# Patient Record
Sex: Male | Born: 1990 | Race: Black or African American | Hispanic: No | Marital: Single | Smoking: Current some day smoker
Health system: Southern US, Community
[De-identification: ages and names within clinical notes are randomized; demographics above are authoritative.]

## PROBLEM LIST (undated history)

## (undated) DIAGNOSIS — N200 Calculus of kidney: Secondary | ICD-10-CM

---

## 2017-02-11 ENCOUNTER — Other Ambulatory Visit: Payer: Self-pay

## 2017-02-11 ENCOUNTER — Encounter (HOSPITAL_BASED_OUTPATIENT_CLINIC_OR_DEPARTMENT_OTHER): Payer: Self-pay | Admitting: *Deleted

## 2017-02-11 ENCOUNTER — Emergency Department (HOSPITAL_BASED_OUTPATIENT_CLINIC_OR_DEPARTMENT_OTHER)
Admission: EM | Admit: 2017-02-11 | Discharge: 2017-02-11 | Disposition: A | Discharge status: Home / Self Care | Payer: Managed Care, Other (non HMO) | Attending: Emergency Medicine | Admitting: Emergency Medicine

## 2017-02-11 DIAGNOSIS — R531 Weakness: Secondary | ICD-10-CM | POA: Diagnosis not present

## 2017-02-11 DIAGNOSIS — Z79899 Other long term (current) drug therapy: Secondary | ICD-10-CM | POA: Diagnosis not present

## 2017-02-11 DIAGNOSIS — F17200 Nicotine dependence, unspecified, uncomplicated: Secondary | ICD-10-CM | POA: Insufficient documentation

## 2017-02-11 DIAGNOSIS — R110 Nausea: Secondary | ICD-10-CM | POA: Diagnosis not present

## 2017-02-11 MED ORDER — ONDANSETRON 4 MG PO TBDP: 4.0000 mg | ORAL_TABLET | Freq: Three times a day (TID) | ORAL | 0 refills | Status: DC | PRN

## 2017-02-11 MED ORDER — ONDANSETRON 4 MG PO TBDP
4.0000 mg | ORAL_TABLET | Freq: Once | ORAL | Status: AC
  Administered 2017-02-11: 4 mg via ORAL
  Filled 2017-02-11: qty 1

## 2017-02-11 MED FILL — ONDANSETRON ODT 4 MG TABLET: 4 | 3 days supply | Qty: 10 | Fill #0

## 2017-02-11 NOTE — ED Triage Notes (Signed)
Pt c/o URI symptoms  x 1 day  

## 2017-02-11 NOTE — ED Notes (Signed)
Pt and family understood dc material. NAD noted. Scripts sent to [facility] pharmacy. Provider aware of DC vitals

## 2017-02-11 NOTE — ED Provider Notes (Signed)
[facility] EMERGENCY DEPARTMENT Provider Note   CSN: [id number] Arrival date & time: 02/11/17  1401     History   Chief Complaint Chief Complaint  Patient presents with  . URI    HPI Louis Obrien is a 27 y.o. male.  Patient presents with acute onset of nausea, weakness, chills starting early this morning.  Patient woke from sleep with severe nausea without abdominal or chest pain.  No vomiting.  Mild muscle aches.  No ear pain, nasal congestion, sore throat.  No significant cough.  No skin rashes.  No diarrhea or constipation.  No urinary symptoms.  Patient feels like he has a flulike illness.  No known contacts.      History reviewed. No pertinent past medical history.  There are no active problems to display for this patient.   History reviewed. No pertinent surgical history.     Home Medications    Prior to Admission medications   Medication Sig Start Date End Date Taking? Authorizing Provider  doxycycline (ADOXA) 100 MG tablet Take 100 mg by mouth 2 (two) times daily.   Yes [provider]  Pseudoeph-Doxylamine-DM-APAP (NYQUIL PO) Take by mouth.   Yes [provider]  ondansetron (ZOFRAN ODT) 4 MG disintegrating tablet Take 1 tablet (4 mg total) by mouth every 8 (eight) hours as needed for nausea or vomiting. 02/11/17   Renne CriglerGeiple, Conception Doebler, PA-C    Family History History reviewed. No pertinent family history.  Social History Social History   Tobacco Use  . Smoking status: Current Some Day Smoker  . Smokeless tobacco: Never Used  Substance Use Topics  . Alcohol use: No    Frequency: Never  . Drug use: No     Allergies   Patient has no known allergies.   Review of Systems Review of Systems  Constitutional: Positive for fatigue. Negative for appetite change and fever.  HENT: Negative for congestion, ear pain, rhinorrhea and sore throat.   Eyes: Negative for redness.  Respiratory: Negative for cough and shortness of breath.     Cardiovascular: Negative for chest pain.  Gastrointestinal: Positive for nausea. Negative for abdominal pain, blood in stool, diarrhea and vomiting.       Negative for hematemesis  Genitourinary: Negative for dysuria.  Musculoskeletal: Positive for myalgias.  Skin: Negative for rash.  Neurological: Negative for light-headedness.     Physical Exam Updated Vital Signs BP (!) 162/109   Pulse 78   Temp 98.2 F (36.8 C) (Oral)   Resp 18   SpO2 99%   Physical Exam  Constitutional: He appears well-developed and well-nourished.  HENT:  Head: Normocephalic and atraumatic.  Right Ear: Tympanic membrane, external ear and ear canal normal.  Left Ear: Tympanic membrane, external ear and ear canal normal.  Nose: Nose normal. No mucosal edema or rhinorrhea.  Mouth/Throat: Uvula is midline and mucous membranes are normal. Mucous membranes are not dry. No trismus in the jaw. No uvula swelling. Posterior oropharyngeal erythema present. No oropharyngeal exudate, posterior oropharyngeal edema or tonsillar abscesses.  Eyes: Conjunctivae are normal. Right eye exhibits no discharge. Left eye exhibits no discharge.  Neck: Normal range of motion. Neck supple.  Cardiovascular: Normal rate, regular rhythm and normal heart sounds.  Pulmonary/Chest: Effort normal and breath sounds normal. No respiratory distress. He has no wheezes. He has no rales.  Abdominal: Soft. There is no tenderness.  Lymphadenopathy:    He has no cervical adenopathy.  Neurological: He is alert.  Skin: Skin is warm and  dry.  Psychiatric: He has a normal mood and affect.  Nursing note and vitals reviewed.    ED Treatments / Results   Procedures Procedures (including critical care time)  Medications Ordered in ED Medications  ondansetron (ZOFRAN-ODT) disintegrating tablet 4 mg (4 mg Oral Given 02/11/17 1614)     Initial Impression / Assessment and Plan / ED Course  I have reviewed the triage vital signs and the nursing  notes.  Pertinent labs & imaging results that were available during my care of the patient were reviewed by me and considered in my medical decision making (see chart for details).     Patient seen and examined.  Exam reassuring.  Suspect either viral illness or early gastroenteritis.  No indications for lab workup, x-ray at this time.  Patient counseled on supportive care for viral URI and s/s to return including worsening symptoms, persistent fever, persistent vomiting, or if they have any other concerns. Urged to see PCP if symptoms persist for more than 3 days. Patient verbalizes understanding and agrees with plan.   Vital signs reviewed and are as follows: BP (!) 162/109   Pulse 78   Temp 98.2 F (36.8 C) (Oral)   Resp 18   SpO2 99%     Final Clinical Impressions(s) / ED Diagnoses   Final diagnoses:  Nausea  Weakness   Patient with vague nausea without abdominal pain or vomiting, generalized fatigue and weakness.  No focal weakness.  Suspect viral syndrome or gastroenteritis.  Patient appears well.  No indication for further imaging at this time.  Discharged home with Zofran.  Return instructions as above.   ED Discharge Orders        Ordered    ondansetron (ZOFRAN ODT) 4 MG disintegrating tablet  Every 8 hours PRN     02/11/17 1602       Renne Crigler, PA-C 02/11/17 1631    Rolland Porter, MD 02/11/17 [phone]

## 2017-02-11 NOTE — Discharge Instructions (Signed)
Please read and follow all provided instructions.  Your diagnoses today include:  1. Nausea   2. Weakness     You appear to have an upper respiratory infection (URI). An upper respiratory tract infection, or cold, is a viral infection of the air passages leading to the lungs. It should improve gradually after 5-7 days. You may have a lingering cough that lasts for 2- 4 weeks after the infection.  Tests performed today include:  Vital signs. See below for your results today.   Medications prescribed:   Zofran (ondansetron) - for nausea and vomiting  Take any prescribed medications only as directed. Treatment for your infection is aimed at treating the symptoms. There are no medications, such as antibiotics, that will cure your infection.   Home care instructions:  Follow any educational materials contained in this packet.   Your illness is contagious and can be spread to others, especially during the first 3 or 4 days. It cannot be cured by antibiotics or other medicines. Take basic precautions such as washing your hands often, covering your mouth when you cough or sneeze, and avoiding public places where you could spread your illness to others.   Please continue drinking plenty of fluids.  Use over-the-counter medicines as needed as directed on packaging for symptom relief.  You may also use ibuprofen or tylenol as directed on packaging for pain or fever.  Do not take multiple medicines containing Tylenol or acetaminophen to avoid taking too much of this medication.  Follow-up instructions: Please follow-up with your primary care provider in the next 3 days for further evaluation of your symptoms if you are not feeling better.   Return instructions:   Please return to the Emergency Department if you experience worsening symptoms.   RETURN IMMEDIATELY IF you develop shortness of breath, confusion or altered mental status, a new rash, become dizzy, faint, or poorly responsive, or are  unable to be cared for at home.  Please return if you have persistent vomiting and cannot keep down fluids or develop a fever that is not controlled by tylenol or motrin.    Please return if you have any other emergent concerns.  Additional Information:  Your vital signs today were: BP (!) 131/95 (BP Location: Right Arm)    Pulse 85    Temp 98.2 F (36.8 C) (Oral)    Resp 18    SpO2 97%  If your blood pressure (BP) was elevated above 135/85 this visit, please have this repeated by your doctor within one month. --------------

## 2017-05-14 ENCOUNTER — Encounter (HOSPITAL_BASED_OUTPATIENT_CLINIC_OR_DEPARTMENT_OTHER): Payer: Self-pay

## 2017-05-14 ENCOUNTER — Emergency Department (HOSPITAL_BASED_OUTPATIENT_CLINIC_OR_DEPARTMENT_OTHER): Payer: Managed Care, Other (non HMO)

## 2017-05-14 ENCOUNTER — Emergency Department (HOSPITAL_BASED_OUTPATIENT_CLINIC_OR_DEPARTMENT_OTHER)
Admission: EM | Admit: 2017-05-14 | Discharge: 2017-05-14 | Disposition: A | Discharge status: Home / Self Care | Payer: Managed Care, Other (non HMO) | Attending: Emergency Medicine | Admitting: Emergency Medicine

## 2017-05-14 DIAGNOSIS — R1032 Left lower quadrant pain: Secondary | ICD-10-CM | POA: Diagnosis present

## 2017-05-14 DIAGNOSIS — F17200 Nicotine dependence, unspecified, uncomplicated: Secondary | ICD-10-CM | POA: Diagnosis not present

## 2017-05-14 DIAGNOSIS — R109 Unspecified abdominal pain: Secondary | ICD-10-CM

## 2017-05-14 DIAGNOSIS — Z79899 Other long term (current) drug therapy: Secondary | ICD-10-CM | POA: Diagnosis not present

## 2017-05-14 HISTORY — DX: Calculus of kidney: N20.0

## 2017-05-14 LAB — CBC
HCT: 37.3 % — ABNORMAL LOW (ref 39.0–52.0)
Hemoglobin: 13.2 g/dL (ref 13.0–17.0)
MCH: 25.6 pg — AB (ref 26.0–34.0)
MCHC: 35.4 g/dL (ref 30.0–36.0)
MCV: 72.3 fL — ABNORMAL LOW (ref 78.0–100.0)
PLATELETS: 267 10*3/uL (ref 150–400)
RBC: 5.16 MIL/uL (ref 4.22–5.81)
RDW: 15 % (ref 11.5–15.5)
WBC: 16.1 10*3/uL — ABNORMAL HIGH (ref 4.0–10.5)

## 2017-05-14 LAB — URINALYSIS, ROUTINE W REFLEX MICROSCOPIC
BILIRUBIN URINE: NEGATIVE
GLUCOSE, UA: NEGATIVE mg/dL
Hgb urine dipstick: NEGATIVE
KETONES UR: NEGATIVE mg/dL
Leukocytes, UA: NEGATIVE
NITRITE: NEGATIVE
PROTEIN: NEGATIVE mg/dL
Specific Gravity, Urine: 1.025 (ref 1.005–1.030)
pH: 6.5 (ref 5.0–8.0)

## 2017-05-14 LAB — BASIC METABOLIC PANEL
Anion gap: 8 (ref 5–15)
BUN: 19 mg/dL (ref 6–20)
CALCIUM: 8.8 mg/dL — AB (ref 8.9–10.3)
CHLORIDE: 106 mmol/L (ref 101–111)
CO2: 26 mmol/L (ref 22–32)
CREATININE: 0.96 mg/dL (ref 0.61–1.24)
Glucose, Bld: 105 mg/dL — ABNORMAL HIGH (ref 65–99)
Potassium: 3.6 mmol/L (ref 3.5–5.1)
SODIUM: 140 mmol/L (ref 135–145)

## 2017-05-14 MED ORDER — SODIUM CHLORIDE 0.9 % IV SOLN
INTRAVENOUS | Status: DC
  Administered 2017-05-14: 09:00:00 via INTRAVENOUS

## 2017-05-14 MED ORDER — HYDROMORPHONE HCL 1 MG/ML IJ SOLN
1.0000 mg | Freq: Once | INTRAMUSCULAR | Status: AC
  Administered 2017-05-14: 1 mg via INTRAVENOUS
  Filled 2017-05-14: qty 1

## 2017-05-14 MED ORDER — ONDANSETRON HCL 4 MG/2ML IJ SOLN
4.0000 mg | Freq: Once | INTRAMUSCULAR | Status: AC
  Administered 2017-05-14: 4 mg via INTRAVENOUS
  Filled 2017-05-14: qty 2

## 2017-05-14 MED ORDER — SODIUM CHLORIDE 0.9 % IV BOLUS
1000.0000 mL | Freq: Once | INTRAVENOUS | Status: AC
  Administered 2017-05-14: 1000 mL via INTRAVENOUS

## 2017-05-14 NOTE — Discharge Instructions (Addendum)
Workup for the flank pain without any acute findings.  Urinalysis negative for urinary tract infection.  Would recommend over-the-counter Motrin for pain.  Work note provided.  Return for any new or worse symptoms.  CT scan did not show any evidence of a kidney stone.

## 2017-05-14 NOTE — ED Triage Notes (Signed)
Pt c/o lt flank pain radiating to groin, urinary difficulty and painful; hx of kidney stones

## 2017-05-14 NOTE — ED Provider Notes (Signed)
[facility] EMERGENCY DEPARTMENT Provider Note   CSN: [id number] Arrival date & time: 05/14/17  0755     History   Chief Complaint Chief Complaint  Patient presents with  . urinary difficultly    HPI Louis Obrien is a 27 y.o. male.  Patient with a history of kidney stones.  Onset 1:00 in the morning of left flank pain no nausea no vomiting.  Urinary urgency no hematuria.  Denies any testicular pain or swelling.  Reminds patient of past kidney stones.     Past Medical History:  Diagnosis Date  . Kidney stones     There are no active problems to display for this patient.   History reviewed. No pertinent surgical history.      Home Medications    Prior to Admission medications   Medication Sig Start Date End Date Taking? Authorizing Provider  doxycycline (ADOXA) 100 MG tablet Take 100 mg by mouth 2 (two) times daily.    [provider]  ondansetron (ZOFRAN ODT) 4 MG disintegrating tablet Take 1 tablet (4 mg total) by mouth every 8 (eight) hours as needed for nausea or vomiting. 02/11/17   Renne CriglerGeiple, Joshua, PA-C  Pseudoeph-Doxylamine-DM-APAP (NYQUIL PO) Take by mouth.    [provider]    Family History No family history on file.  Social History Social History   Tobacco Use  . Smoking status: Current Some Day Smoker  . Smokeless tobacco: Never Used  Substance Use Topics  . Alcohol use: No    Frequency: Never  . Drug use: No     Allergies   Patient has no known allergies.   Review of Systems Review of Systems  Constitutional: Negative for fever.  HENT: Negative for congestion.   Eyes: Negative for redness.  Respiratory: Negative for shortness of breath.   Cardiovascular: Negative for chest pain.  Gastrointestinal: Negative for abdominal pain, nausea and vomiting.  Genitourinary: Positive for flank pain and urgency. Negative for discharge, hematuria, scrotal swelling and testicular pain.  Musculoskeletal: Negative for back  pain.  Skin: Negative for rash.  Neurological: Negative for syncope and headaches.  Hematological: Does not bruise/bleed easily.  Psychiatric/Behavioral: Negative for confusion.     Physical Exam Updated Vital Signs BP (!) 150/85 (BP Location: Left Arm)   Pulse 96   Temp 97.8 F (36.6 C) (Oral)   Resp 18   Ht 1.829 m (6')   Wt 136.1 kg (300 lb)   SpO2 100%   BMI 40.69 kg/m   Physical Exam  Constitutional: He is oriented to person, place, and time. He appears well-developed and well-nourished. No distress.  HENT:  Head: Normocephalic.  Mouth/Throat: Oropharynx is clear and moist.  Eyes: Pupils are equal, round, and reactive to light. Conjunctivae and EOM are normal.  Neck: Neck supple.  Cardiovascular: Normal rate, regular rhythm and normal heart sounds.  Pulmonary/Chest: Effort normal and breath sounds normal. No respiratory distress.  Abdominal: Soft. Bowel sounds are normal. There is no tenderness.  Genitourinary:  Genitourinary Comments: No groin swelling or tenderness to palpation.  No scrotal swelling.  Musculoskeletal: Normal range of motion.  Neurological: He is alert and oriented to person, place, and time. No cranial nerve deficit or sensory deficit. He exhibits normal muscle tone. Coordination normal.  Skin: Skin is warm.  Nursing note and vitals reviewed.    ED Treatments / Results  Labs (all labs ordered are listed, but only abnormal results are displayed) Labs Reviewed  CBC - Abnormal; Notable for the  following components:      Result Value   WBC 16.1 (*)    HCT 37.3 (*)    MCV 72.3 (*)    MCH 25.6 (*)    All other components within normal limits  BASIC METABOLIC PANEL - Abnormal; Notable for the following components:   Glucose, Bld 105 (*)    Calcium 8.8 (*)    All other components within normal limits  URINALYSIS, ROUTINE W REFLEX MICROSCOPIC    EKG None  Radiology Ct Renal Stone Study  Result Date: 05/14/2017 CLINICAL DATA:  Flank pain  with nausea EXAM: CT ABDOMEN AND PELVIS WITHOUT CONTRAST TECHNIQUE: Multidetector CT imaging of the abdomen and pelvis was performed following the standard protocol without oral or IV contrast. COMPARISON:  None. FINDINGS: Lower chest: Lung bases are clear. Hepatobiliary: No focal liver lesions are apparent on this noncontrast enhanced study. Gallbladder wall is not appreciably thickened. There is no biliary duct dilatation. Pancreas: No pancreatic mass or inflammatory focus. Spleen: No splenic lesions are evident. Adrenals/Urinary Tract: Adrenals appear normal bilaterally. Kidneys bilaterally show no evident mass or hydronephrosis on either side. There is no evident renal or ureteral calculus on either side. Urinary bladder is midline with wall thickness within normal limits. Stomach/Bowel: There is no appreciable bowel wall or mesenteric thickening. No evident bowel obstruction. No free air or portal venous air. Vascular/Lymphatic: No demonstrable abdominal aortic aneurysm. No vascular lesions are evident. No adenopathy is appreciable in the abdomen or pelvis. Reproductive: Prostate and seminal vesicles are normal in size and contour. No evident pelvic mass. Other: Appendix appears normal. No ascites or abscess evident in the abdomen or pelvis. There is a small ventral hernia containing only fat. Musculoskeletal: There are no blastic or lytic bone lesions. There is no intramuscular or abdominal wall lesion. IMPRESSION: 1.  No renal or ureteral calculus.  No hydronephrosis. 2. No evident bowel obstruction. No abscess. Appendix appears normal. 3.  Small ventral hernia containing only fat. Electronically Signed   By: Bretta Bang III M.D.   On: 05/14/2017 09:01    Procedures Procedures (including critical care time)  Medications Ordered in ED Medications  0.9 %  sodium chloride infusion ( Intravenous New Bag/Given 05/14/17 0832)  ondansetron (ZOFRAN) injection 4 mg (4 mg Intravenous Given 05/14/17 0830)    HYDROmorphone (DILAUDID) injection 1 mg (1 mg Intravenous Given 05/14/17 0831)  sodium chloride 0.9 % bolus 1,000 mL (0 mLs Intravenous Stopped 05/14/17 1021)     Initial Impression / Assessment and Plan / ED Course  I have reviewed the triage vital signs and the nursing notes.  Pertinent labs & imaging results that were available during my care of the patient were reviewed by me and considered in my medical decision making (see chart for details).    Symptoms clinically seem to be consistent with left flank pain and kidney stone.  However CT scan renal stone negative.  Urinalysis negative renal function negative labs without significant abnormalities.  Patient improved with pain medicine here no recurrence of pain.  Patient stable for discharge home.  Work note provided.  Patient will be treated with Naprosyn as needed.  Final Clinical Impressions(s) / ED Diagnoses   Final diagnoses:  Flank pain, acute    ED Discharge Orders    None       Vanetta Mulders, MD 05/14/17 1250

## 2017-10-20 ENCOUNTER — Emergency Department (HOSPITAL_BASED_OUTPATIENT_CLINIC_OR_DEPARTMENT_OTHER): Payer: Managed Care, Other (non HMO)

## 2017-10-20 ENCOUNTER — Encounter (HOSPITAL_BASED_OUTPATIENT_CLINIC_OR_DEPARTMENT_OTHER): Payer: Self-pay | Admitting: *Deleted

## 2017-10-20 ENCOUNTER — Other Ambulatory Visit: Payer: Self-pay

## 2017-10-20 ENCOUNTER — Emergency Department (HOSPITAL_BASED_OUTPATIENT_CLINIC_OR_DEPARTMENT_OTHER)
Admission: EM | Admit: 2017-10-20 | Discharge: 2017-10-20 | Disposition: A | Discharge status: Home / Self Care | Payer: Managed Care, Other (non HMO) | Attending: Emergency Medicine | Admitting: Emergency Medicine

## 2017-10-20 DIAGNOSIS — Y998 Other external cause status: Secondary | ICD-10-CM | POA: Diagnosis not present

## 2017-10-20 DIAGNOSIS — Y9389 Activity, other specified: Secondary | ICD-10-CM | POA: Diagnosis not present

## 2017-10-20 DIAGNOSIS — Y929 Unspecified place or not applicable: Secondary | ICD-10-CM | POA: Insufficient documentation

## 2017-10-20 DIAGNOSIS — F17200 Nicotine dependence, unspecified, uncomplicated: Secondary | ICD-10-CM | POA: Insufficient documentation

## 2017-10-20 DIAGNOSIS — W108XXA Fall (on) (from) other stairs and steps, initial encounter: Secondary | ICD-10-CM | POA: Insufficient documentation

## 2017-10-20 DIAGNOSIS — S99912A Unspecified injury of left ankle, initial encounter: Secondary | ICD-10-CM | POA: Diagnosis present

## 2017-10-20 DIAGNOSIS — S93402A Sprain of unspecified ligament of left ankle, initial encounter: Secondary | ICD-10-CM | POA: Diagnosis not present

## 2017-10-20 MED ORDER — IBUPROFEN 800 MG PO TABS
800.0000 mg | ORAL_TABLET | Freq: Once | ORAL | Status: AC
  Administered 2017-10-20: 800 mg via ORAL
  Filled 2017-10-20: qty 1

## 2017-10-20 NOTE — ED Provider Notes (Signed)
[facility] EMERGENCY DEPARTMENT Provider Note  CSN: [id number] Arrival date & time: 10/20/17  1545   History   Chief Complaint Chief Complaint  Patient presents with  . Ankle Injury    HPI Louis Obrien is a 27 y.o. male with no significant medical history who presented to the ED for left ankle pain. This began after he accidentally slipped on his wet steps and fell down them ~ 30 min before arrival. He describes everting his ankle.He describes lateral swelling and pain with weight bearing and ambulation. No specific relieving factors. Pain worse with movement.  Denies color or temperature change, paresthesias or weakness in his lower extremities. Denies hitting his head during the fall and has no other arthralgias. Patient endorses minor abrasions on the medial aspects of his ankles from the accident. Patient has not had any intervention before coming o the ED.  Past Medical History:  Diagnosis Date  . Kidney stones     There are no active problems to display for this patient.   History reviewed. No pertinent surgical history.      Home Medications    Prior to Admission medications   Not on File    Family History History reviewed. No pertinent family history.  Social History Social History   Tobacco Use  . Smoking status: Current Some Day Smoker  . Smokeless tobacco: Never Used  Substance Use Topics  . Alcohol use: No    Frequency: Never  . Drug use: No     Allergies   Patient has no known allergies.   Review of Systems Review of Systems  Constitutional: Negative.   Musculoskeletal: Positive for arthralgias, gait problem and joint swelling. Negative for back pain and neck pain.  Skin: Positive for wound.  Neurological: Negative for weakness and numbness.  Hematological: Negative.      Physical Exam Updated Vital Signs BP (!) 144/102   Pulse 95   Temp 98.5 F (36.9 C) (Oral)   Resp 16   Ht 6' (1.829 m)   Wt (!) 145.2 kg   SpO2 98%    BMI 43.40 kg/m   Physical Exam  Constitutional: No distress.  Obese  Cardiovascular:  Pulses:      Dorsalis pedis pulses are 2+ on the right side, and 2+ on the left side.       Posterior tibial pulses are 2+ on the right side, and 2+ on the left side.  Musculoskeletal:       Left knee: Normal.       Left ankle: He exhibits swelling. He exhibits normal range of motion, no deformity and normal pulse. Tenderness. AITFL tenderness found. Achilles tendon normal.       Left foot: There is tenderness and swelling. There is normal range of motion and no bony tenderness.  Able to ambulate and bear weight, but endorses pain with this. Full ROM in lower extremities bilaterally with 5/5 strength.  Skin: Skin is warm. Capillary refill takes less than 2 seconds.  Superficial abrasions on medial aspect of ankles bilaterally.  Nursing note and vitals reviewed.  ED Treatments / Results  Labs (all labs ordered are listed, but only abnormal results are displayed) Labs Reviewed - No data to display  EKG None  Radiology Dg Ankle Complete Left  Result Date: 10/20/2017 CLINICAL DATA:  Fall EXAM: LEFT ANKLE COMPLETE - 3+ VIEW COMPARISON:  None. FINDINGS: Spurring at the posterior calcaneus. No acute fracture. No dislocation. Unremarkable soft tissues. IMPRESSION: No acute bony pathology.  Electronically Signed   By: Jolaine Click M.D.   On: 10/20/2017 16:11    Procedures Procedures (including critical care time)  Medications Ordered in ED Medications  ibuprofen (ADVIL,MOTRIN) tablet 800 mg (has no administration in time range)     Initial Impression / Assessment and Plan / ED Course  Triage vital signs and the nursing notes have been reviewed.  Pertinent labs & imaging results that were available during care of the patient were reviewed and considered in medical decision making (see chart for details). Clinical Course as of Oct 20 1656  Wed Oct 20, 2017  1657 No fractures or dislocations  seen on ankle x-ray.   [GM]    Clinical Course User Index [GM] Windy Carina, [place]   Patient presents following a mechanical fall down wet steps. Primary complaint is left ankle pain which he twisted during the accident. Patient has full sensation in foot ankle. He also has full active and passive ROM. No deformities, decreased muscle tone or other abnormalities visualized. Neurovascular function is intact. Physical exam and x-rays are reassuring. There are no other physical exam findings or s/s that suggest an underlying infectious or rheumatologic process that warrant further evaluation or intervention today.  Final Clinical Impressions(s) / ED Diagnoses  1. Left Ankle Sprain. Education provided on OTC and supportive treatment for pain relief and inflammation.  Dispo: Home. After thorough clinical evaluation, this patient is determined to be medically stable and can be safely discharged with the previously mentioned treatment and/or outpatient follow-up/referral(s). At this time, there are no other apparent medical conditions that require further screening, evaluation or treatment.   Final diagnoses:  Sprain of left ankle, unspecified ligament, initial encounter    ED Discharge Orders    None        Reva Bores 10/20/17 1658    Terrilee Files, MD 10/21/17 [phone]

## 2017-10-20 NOTE — ED Notes (Signed)
ED Provider at bedside. 

## 2017-10-20 NOTE — Discharge Instructions (Addendum)
Your x-rays today were normal. Your ankle is not broken or dislocated.  You will continue to have pain and soreness in your foot for the next 1-2 weeks, but it should begin to improve from there. Muscles take 4-6 weeks to heal completely. Do not be surprised if there is bruising around your ankle after today that will go away. You may use Tylenol and/or Ibuprofen for pain relief and swelling. You may also use cold compresses and ankle braces for additional relief. Ankle braces may be worn during the day and with physical activity, but should be removed when resting at home, during baths and when sleeping.

## 2017-10-20 NOTE — ED Triage Notes (Signed)
Pt c/o left ankle injury x 15 mins ago

## 2018-04-19 ENCOUNTER — Encounter (HOSPITAL_BASED_OUTPATIENT_CLINIC_OR_DEPARTMENT_OTHER): Payer: Self-pay

## 2018-04-19 ENCOUNTER — Other Ambulatory Visit: Payer: Self-pay

## 2018-04-19 ENCOUNTER — Emergency Department (HOSPITAL_BASED_OUTPATIENT_CLINIC_OR_DEPARTMENT_OTHER)
Admission: EM | Admit: 2018-04-19 | Discharge: 2018-04-19 | Disposition: A | Discharge status: Home / Self Care | Payer: Managed Care, Other (non HMO) | Attending: Emergency Medicine | Admitting: Emergency Medicine

## 2018-04-19 DIAGNOSIS — F17200 Nicotine dependence, unspecified, uncomplicated: Secondary | ICD-10-CM | POA: Insufficient documentation

## 2018-04-19 DIAGNOSIS — R300 Dysuria: Secondary | ICD-10-CM

## 2018-04-19 DIAGNOSIS — Z113 Encounter for screening for infections with a predominantly sexual mode of transmission: Secondary | ICD-10-CM | POA: Insufficient documentation

## 2018-04-19 LAB — URINALYSIS, ROUTINE W REFLEX MICROSCOPIC
Bilirubin Urine: NEGATIVE
Glucose, UA: NEGATIVE mg/dL
KETONES UR: NEGATIVE mg/dL
Nitrite: NEGATIVE
PH: 7 (ref 5.0–8.0)
Protein, ur: NEGATIVE mg/dL
Specific Gravity, Urine: 1.02 (ref 1.005–1.030)

## 2018-04-19 LAB — URINALYSIS, MICROSCOPIC (REFLEX)

## 2018-04-19 MED ORDER — CEFTRIAXONE SODIUM 250 MG IJ SOLR
250.0000 mg | Freq: Once | INTRAMUSCULAR | Status: AC
  Administered 2018-04-19: 250 mg via INTRAMUSCULAR
  Filled 2018-04-19: qty 250

## 2018-04-19 MED ORDER — AZITHROMYCIN 250 MG PO TABS
1000.0000 mg | ORAL_TABLET | Freq: Once | ORAL | Status: AC
Start: 2018-04-19 — End: 2018-04-19
  Administered 2018-04-19: 1000 mg via ORAL
  Filled 2018-04-19: qty 4

## 2018-04-19 NOTE — ED Triage Notes (Signed)
Pt c/o burning after urination for the last two days, denies penile discharge

## 2018-04-19 NOTE — ED Provider Notes (Signed)
[facility] Provider Note: Louis Dell, MD, FACEP  CSN: [id number] MRN: [id number] ARRIVAL: 04/19/18 at 0128 ROOM: [facility]   CHIEF COMPLAINT  Dysuria   HISTORY OF PRESENT ILLNESS  04/19/18 2:49 AM Louis Obrien is a 28 y.o. male with a 2-day history of dysuria.  He describes this as burning with urination.  It is not severe.  He denies urethral discharge or stains in his underwear.  He did have sexual intercourse about 2 weeks ago in which the condom broke.  He denies abdominal pain.   Past Medical History:  Diagnosis Date  . Kidney stones     History reviewed. No pertinent surgical history.  No family history on file.  Social History   Tobacco Use  . Smoking status: Current Some Day Smoker  . Smokeless tobacco: Never Used  Substance Use Topics  . Alcohol use: No    Frequency: Never  . Drug use: No    Prior to Admission medications   Not on File    Allergies Patient has no known allergies.   REVIEW OF SYSTEMS  Negative except as noted here or in the History of Present Illness.   PHYSICAL EXAMINATION  Initial Vital Signs Blood pressure (!) 180/103, pulse 99, temperature 98.6 F (37 C), temperature source Oral, resp. rate 18, height 6' (1.829 m), weight (!) 154.2 kg, SpO2 98 %.  Examination General: Well-developed, well-nourished male in no acute distress; appearance consistent with age of record HENT: normocephalic; atraumatic Eyes: Normal appearance Neck: supple Heart: regular rate and rhythm Lungs: clear to auscultation bilaterally Abdomen: soft; nondistended; nontender; bowel sounds present GU: Tanner V male, circumcised; no urethral discharge Extremities: No deformity; full range of motion Neurologic: Awake, alert and oriented; motor function intact in all extremities and symmetric; no facial droop Skin: Warm and dry Psychiatric: Normal mood and affect   RESULTS  Summary of this visit's results, reviewed by myself:   EKG  Interpretation  Date/Time:    Ventricular Rate:    PR Interval:    QRS Duration:   QT Interval:    QTC Calculation:   R Axis:     Text Interpretation:        Laboratory Studies: Results for orders placed or performed during the hospital encounter of 04/19/18 (from the past 24 hour(s))  Urinalysis, Routine w reflex microscopic     Status: Abnormal   Collection Time: 04/19/18  1:35 AM  Result Value Ref Range   Color, Urine YELLOW YELLOW   APPearance CLEAR CLEAR   Specific Gravity, Urine 1.020 1.005 - 1.030   pH 7.0 5.0 - 8.0   Glucose, UA NEGATIVE NEGATIVE mg/dL   Hgb urine dipstick TRACE (A) NEGATIVE   Bilirubin Urine NEGATIVE NEGATIVE   Ketones, ur NEGATIVE NEGATIVE mg/dL   Protein, ur NEGATIVE NEGATIVE mg/dL   Nitrite NEGATIVE NEGATIVE   Leukocytes,Ua TRACE (A) NEGATIVE  Urinalysis, Microscopic (reflex)     Status: Abnormal   Collection Time: 04/19/18  1:35 AM  Result Value Ref Range   RBC / HPF 0-5 0 - 5 RBC/hpf   WBC, UA 21-50 0 - 5 WBC/hpf   Bacteria, UA MANY (A) NONE SEEN   Squamous Epithelial / LPF 0-5 0 - 5   Mucus PRESENT    Imaging Studies: No results found.  ED COURSE and MDM  Nursing notes and initial vitals signs, including pulse oximetry, reviewed.  Vitals:   04/19/18 0133 04/19/18 0134  BP:  (!) 180/103  Pulse:  99  Resp:  18  Temp:  98.6 F (37 C)  TempSrc:  Oral  SpO2:  98%  Weight: (!) 154.2 kg   Height: 6' (1.829 m)    We will treat for GC and chlamydia.  Urine has been sent for culture.  Patient also requested blood tests for HIV and syphilis.  PROCEDURES    ED DIAGNOSES     ICD-10-CM   1. Dysuria R30.0        [name], Jonny Ruiz, MD 04/19/18 [phone]

## 2018-04-20 LAB — GC/CHLAMYDIA PROBE AMP (~~LOC~~) NOT AT ARMC
Chlamydia: NEGATIVE
Neisseria Gonorrhea: NEGATIVE

## 2018-04-20 LAB — RPR: RPR: NONREACTIVE

## 2018-04-20 LAB — URINE CULTURE: CULTURE: NO GROWTH

## 2018-04-20 LAB — HIV ANTIBODY (ROUTINE TESTING W REFLEX): HIV Screen 4th Generation wRfx: NONREACTIVE

## 2019-05-18 IMAGING — CT CT RENAL STONE PROTOCOL
2 of 4 series · 17 of 46 positions shown, 19 images · non-contrast
Comparison: None.

CLINICAL DATA: Flank pain with nausea

EXAM:
CT ABDOMEN AND PELVIS WITHOUT CONTRAST
TECHNIQUE: Multidetector CT imaging of the abdomen and pelvis was performed
following the standard protocol without oral or IV contrast.

[Series 2: axial st · axial · 0.82mm/px · z∈[-581,-101]mm · 14 of 106 slices shown, 16 images]
[im 5/106  soft-tissue]
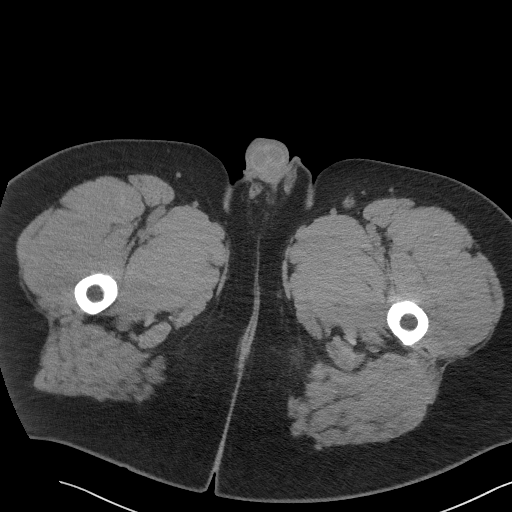
[im 5/106  bone]
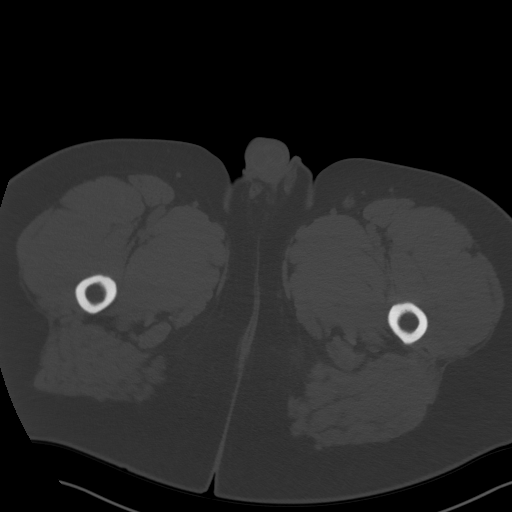
[im 13/106  soft-tissue]
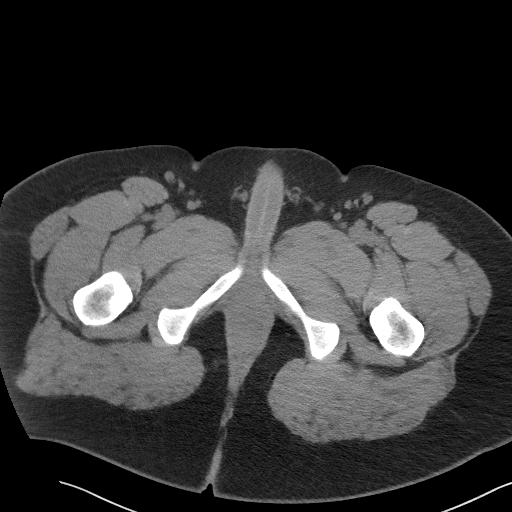
[im 22/106  soft-tissue]
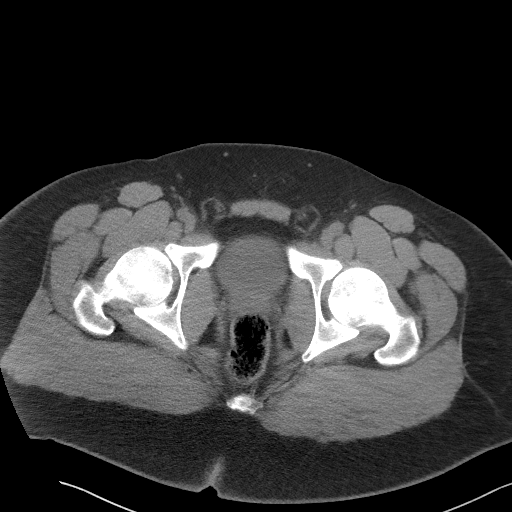
[im 30/106  soft-tissue]
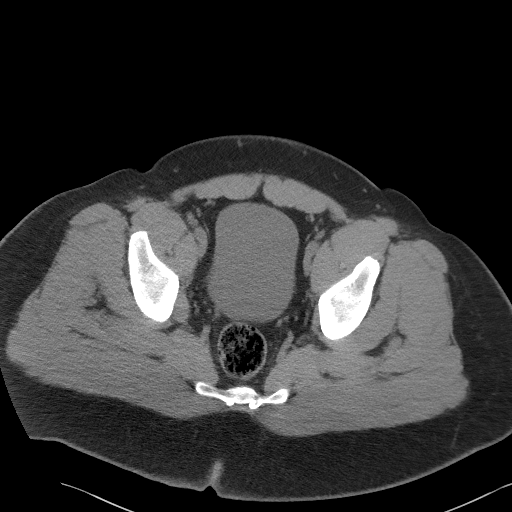
[im 34/106  soft-tissue]
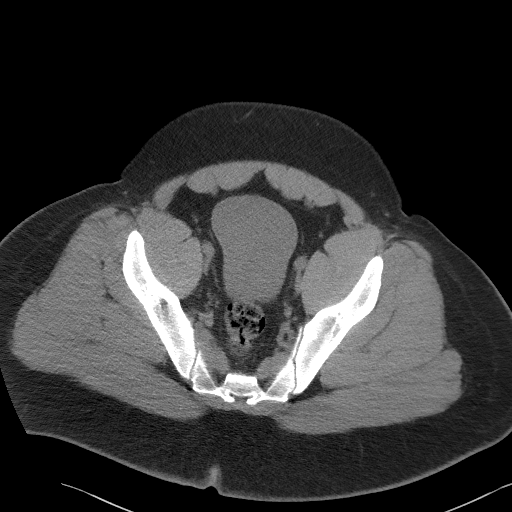
[im 43/106  soft-tissue]
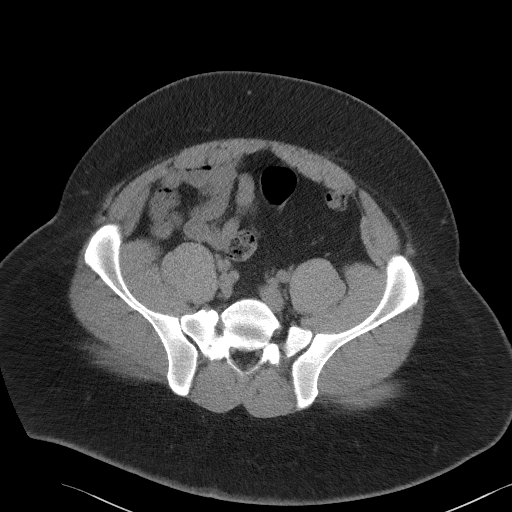
[im 51/106  soft-tissue]
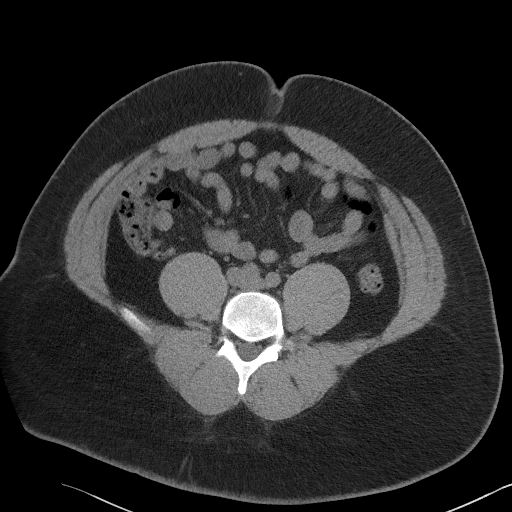
[im 55/106  soft-tissue]
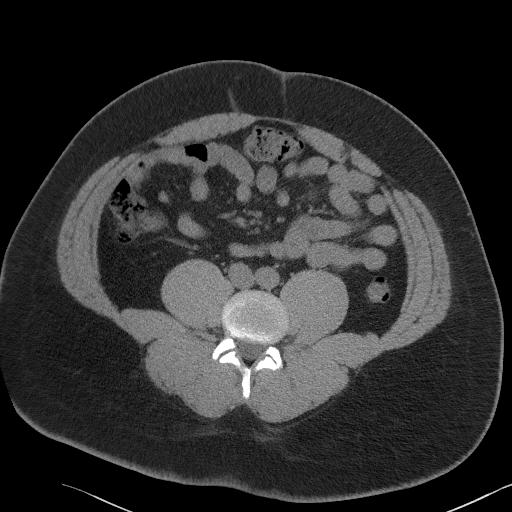
[im 64/106  soft-tissue]
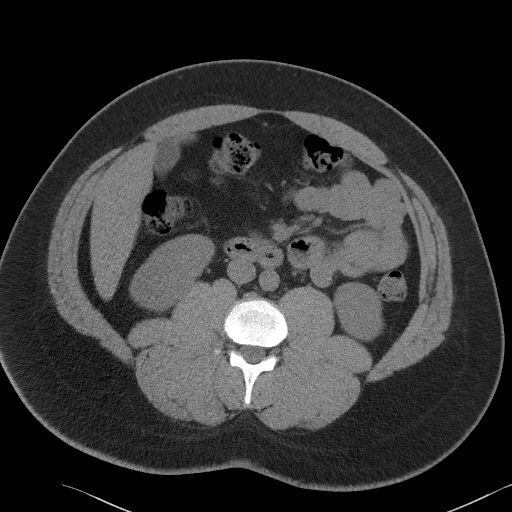
[im 64/106  bone]
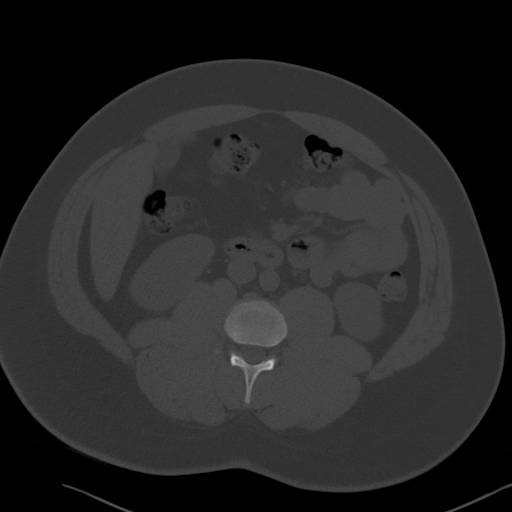
[im 72/106  soft-tissue]
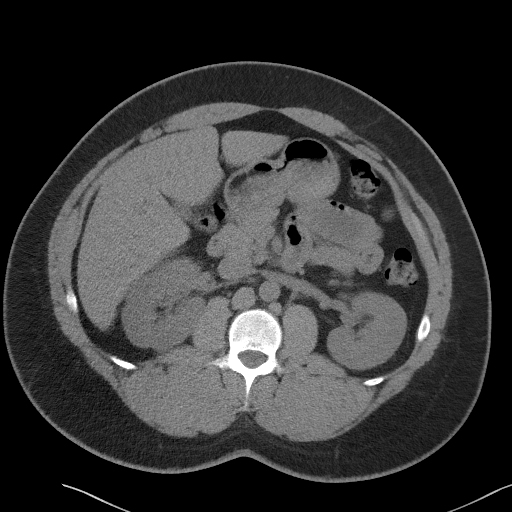
[im 80/106  soft-tissue]
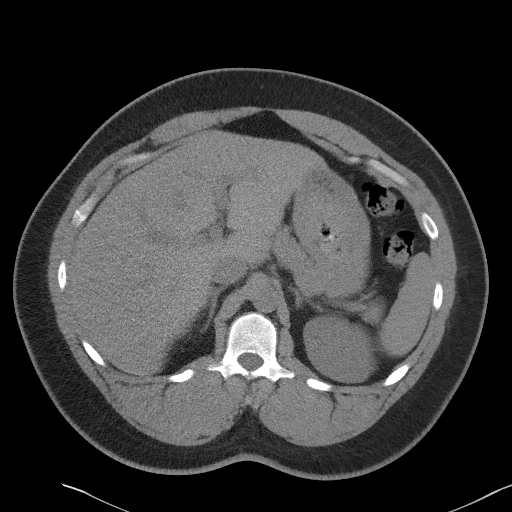
[im 85/106  soft-tissue]
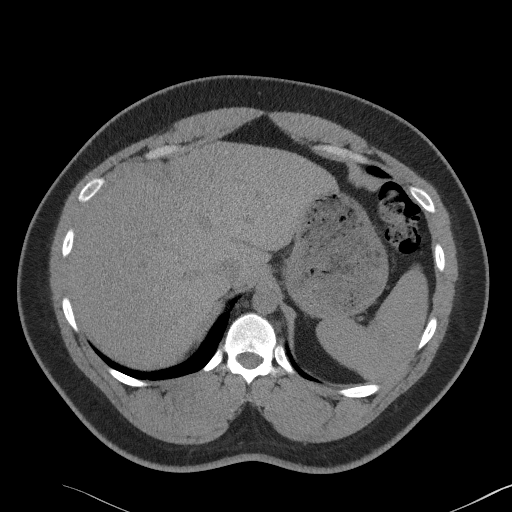
[im 93/106  soft-tissue]
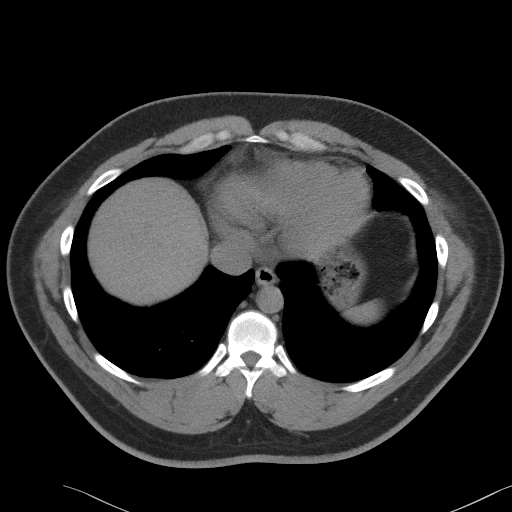
[im 101/106  soft-tissue]
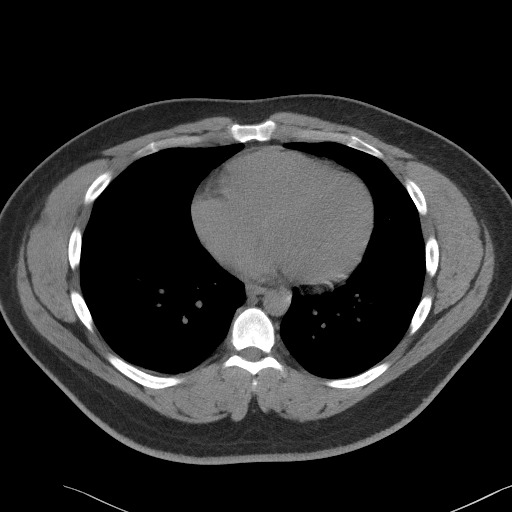

[Series 5: coronal st · coronal · 1.00mm/px · 3 of 110 slices shown]
[im 37/110  soft-tissue]
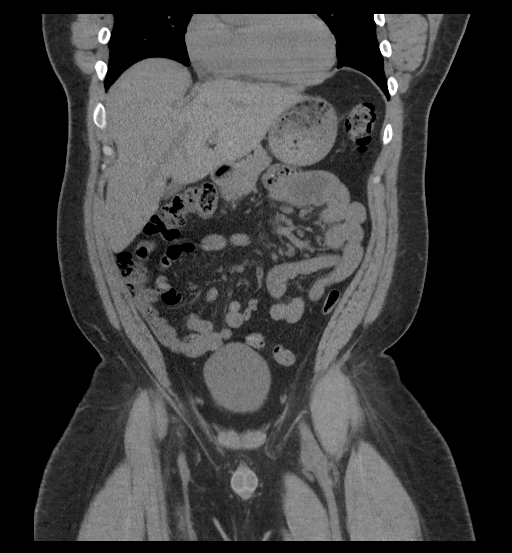
[im 49/110  soft-tissue]
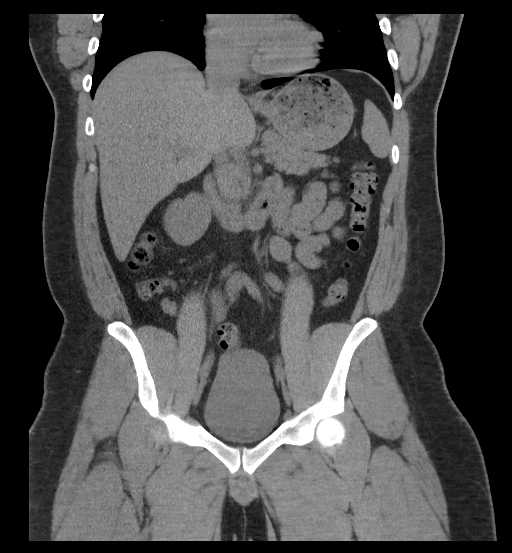
[im 61/110  soft-tissue]
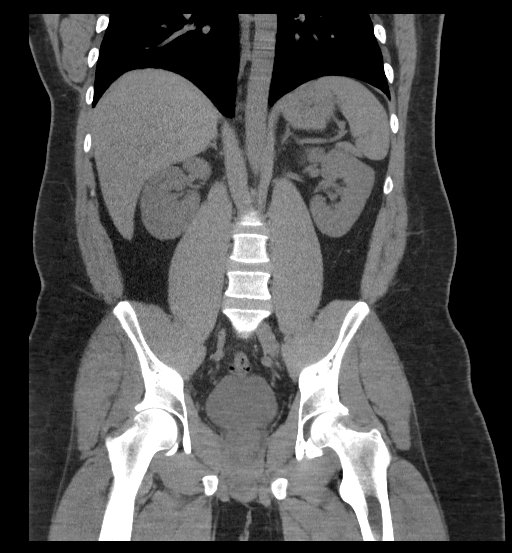

[17 of 46 positions shown; findings below may reference images not displayed]

FINDINGS: Lower chest: Lung bases are clear.

Hepatobiliary: No focal liver lesions are apparent on this
noncontrast enhanced study. Gallbladder wall is not appreciably
thickened. There is no biliary duct dilatation.

Pancreas: No pancreatic mass or inflammatory focus.

Spleen: No splenic lesions are evident.

Adrenals/Urinary Tract: Adrenals appear normal bilaterally. Kidneys
bilaterally show no evident mass or hydronephrosis on either side.
There is no evident renal or ureteral calculus on either side.
Urinary bladder is midline with wall thickness within normal limits.

Stomach/Bowel: There is no appreciable bowel wall or mesenteric
thickening. No evident bowel obstruction. No free air or portal
venous air.

Vascular/Lymphatic: No demonstrable abdominal aortic aneurysm. No
vascular lesions are evident. No adenopathy is appreciable in the
abdomen or pelvis.

Reproductive: Prostate and seminal vesicles are normal in size and
contour. No evident pelvic mass.

Other: Appendix appears normal. No ascites or abscess evident in the
abdomen or pelvis. There is a small ventral hernia containing only
fat.

Musculoskeletal: There are no blastic or lytic bone lesions. There
is no intramuscular or abdominal wall lesion.
IMPRESSION: 1.  No renal or ureteral calculus.  No hydronephrosis.

2. No evident bowel obstruction. No abscess. Appendix appears
normal.

3.  Small ventral hernia containing only fat.

## 2019-10-24 IMAGING — CR DG ANKLE COMPLETE 3+V*L*
3 series · 3 of 3 positions shown · non-contrast
Comparison: None.

CLINICAL DATA: Fall

EXAM:
LEFT ANKLE COMPLETE - 3+ VIEW

[t ankle joint ap left]
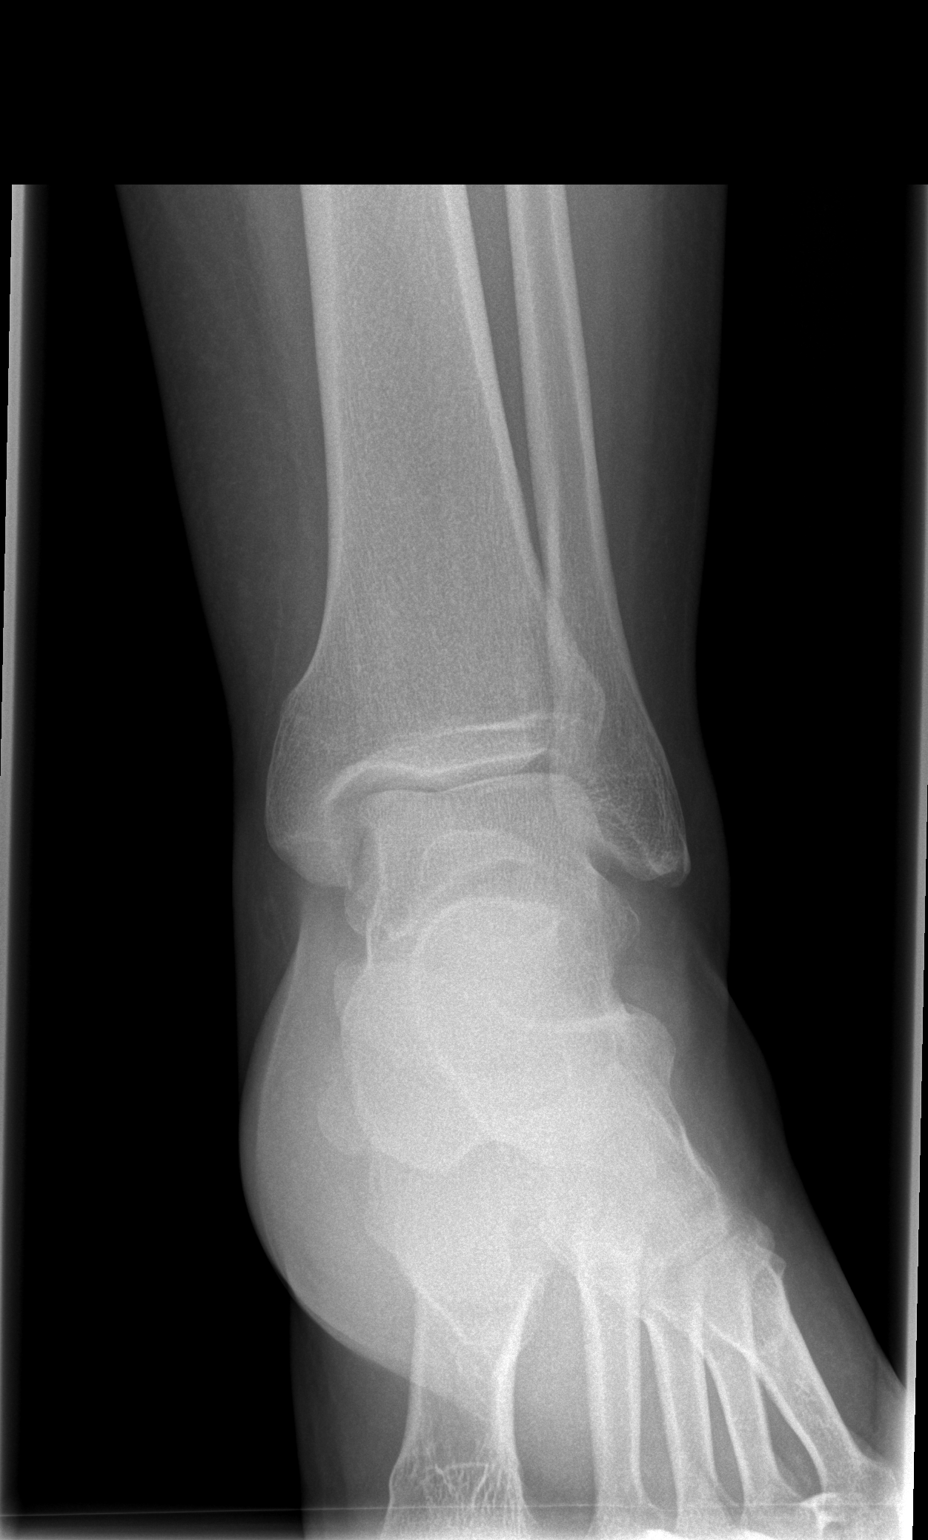

[t ankle joint oblique left]
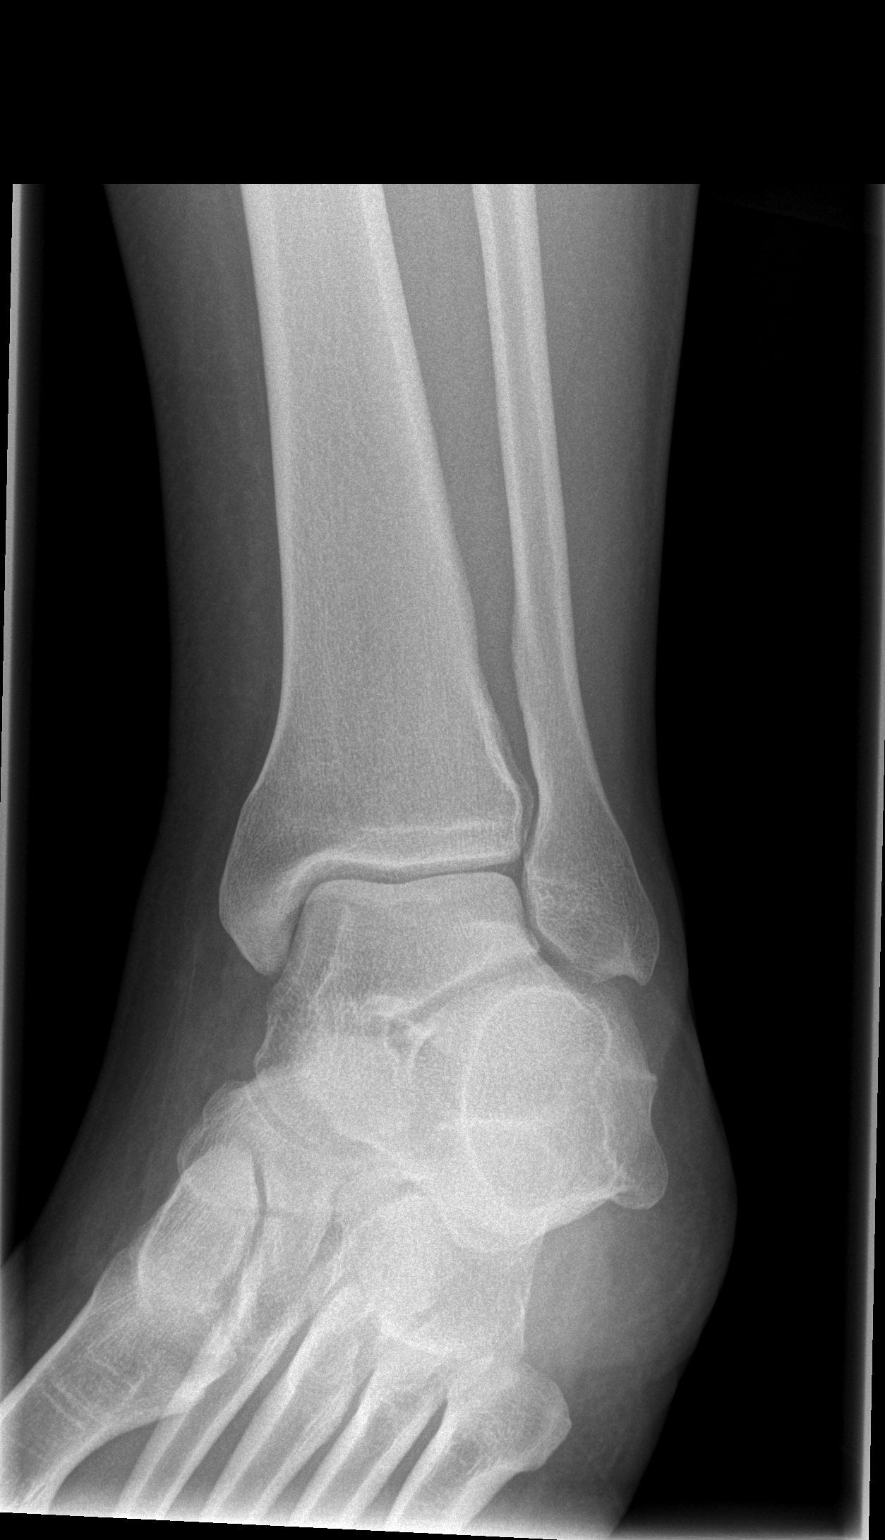

[t ankle joint lat left]
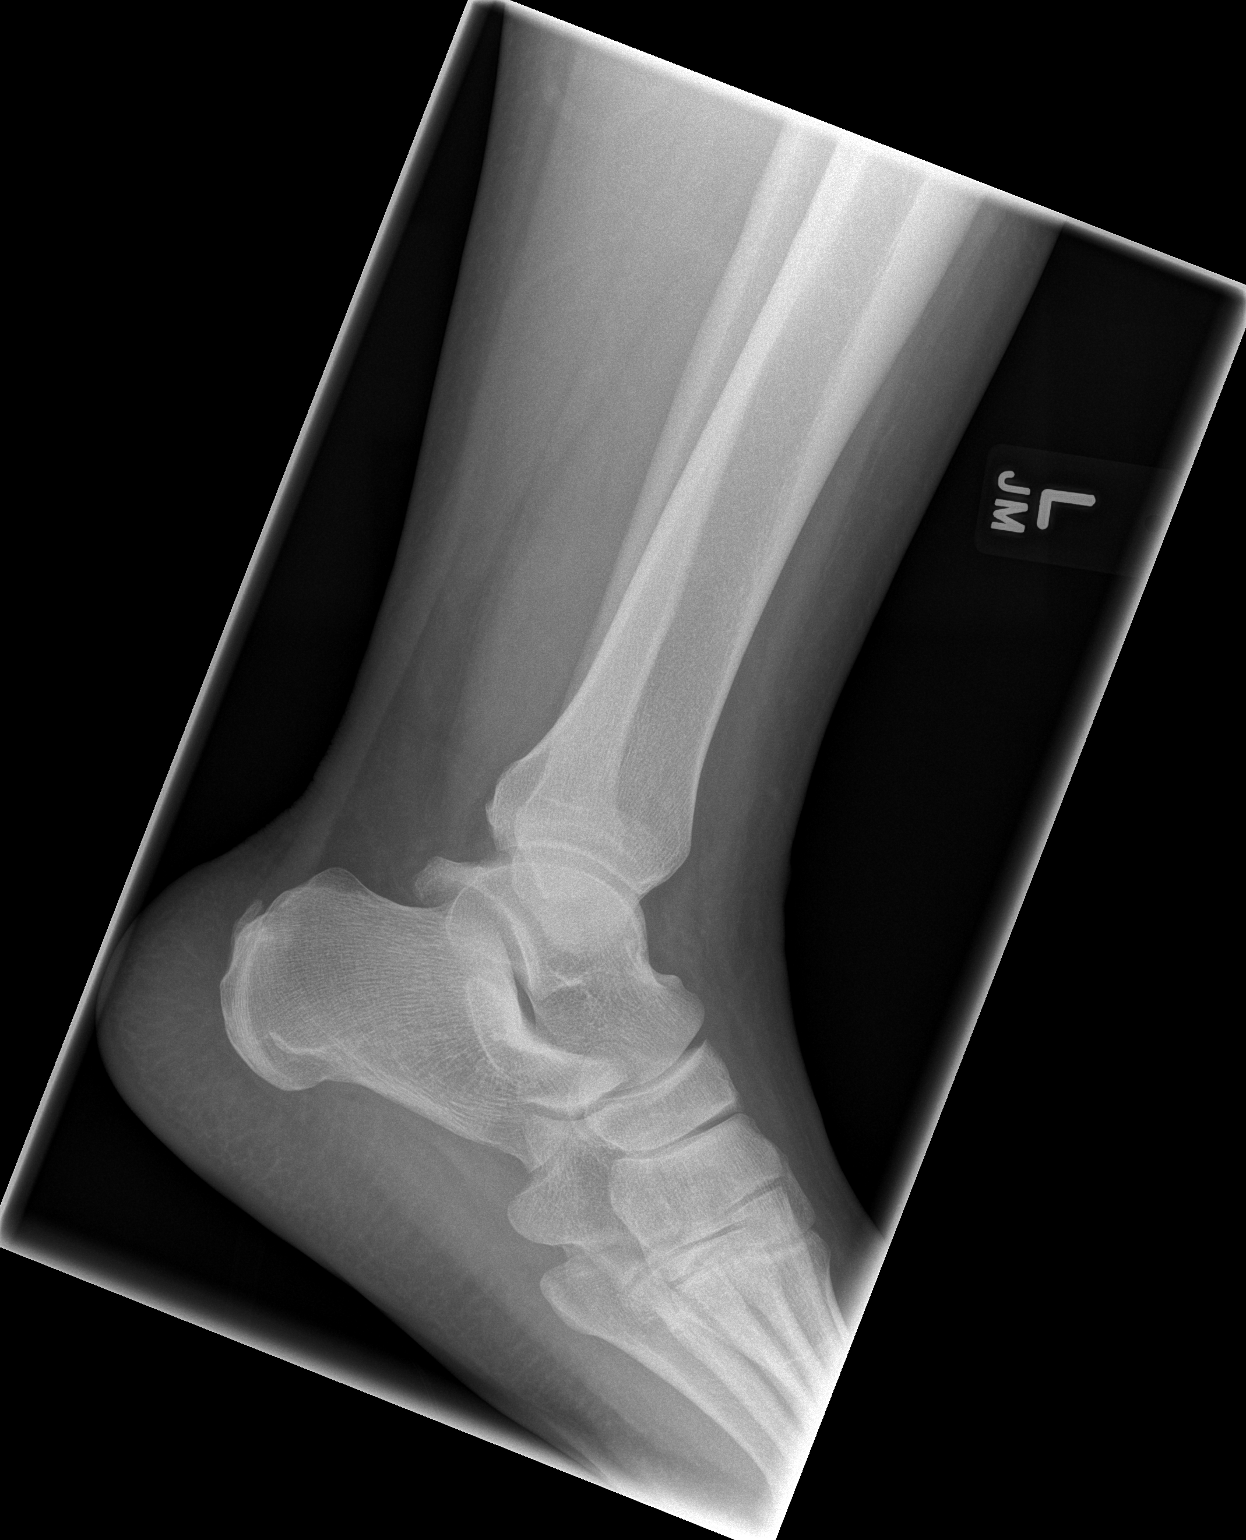

[3 of 3 positions shown; findings below may reference images not displayed]

FINDINGS: Spurring at the posterior calcaneus. No acute fracture. No
dislocation. Unremarkable soft tissues.
IMPRESSION: No acute bony pathology.
